# Patient Record
Sex: Female | Born: 1935 | Race: White | Hispanic: No
Health system: Southern US, Community
[De-identification: ages and names within clinical notes are randomized; demographics above are authoritative.]

---

## 2004-10-28 ENCOUNTER — Ambulatory Visit: Payer: Self-pay | Admitting: General Surgery

## 2005-06-02 ENCOUNTER — Ambulatory Visit: Payer: Self-pay | Admitting: Family Medicine

## 2005-06-07 ENCOUNTER — Ambulatory Visit: Payer: Self-pay | Admitting: Family Medicine

## 2005-06-21 ENCOUNTER — Ambulatory Visit: Payer: Self-pay | Admitting: Family Medicine

## 2005-12-27 ENCOUNTER — Ambulatory Visit: Payer: Self-pay | Admitting: Family Medicine

## 2006-06-06 ENCOUNTER — Ambulatory Visit: Payer: Self-pay | Admitting: Family Medicine

## 2006-08-10 ENCOUNTER — Ambulatory Visit: Payer: Self-pay | Admitting: Unknown Physician Specialty

## 2006-11-28 ENCOUNTER — Ambulatory Visit: Payer: Self-pay | Admitting: Unknown Physician Specialty

## 2007-03-14 ENCOUNTER — Ambulatory Visit: Payer: Self-pay | Admitting: Cardiology

## 2007-04-01 ENCOUNTER — Ambulatory Visit: Payer: Self-pay | Admitting: Unknown Physician Specialty

## 2007-04-09 ENCOUNTER — Inpatient Hospital Stay: Payer: Self-pay | Admitting: Unknown Physician Specialty

## 2007-04-14 ENCOUNTER — Encounter: Payer: Self-pay | Admitting: Internal Medicine

## 2007-06-12 ENCOUNTER — Ambulatory Visit: Payer: Self-pay | Admitting: Family Medicine

## 2008-06-16 ENCOUNTER — Ambulatory Visit: Payer: Self-pay | Admitting: Family Medicine

## 2008-11-01 ENCOUNTER — Emergency Department: Payer: Self-pay | Admitting: Emergency Medicine

## 2008-11-03 ENCOUNTER — Ambulatory Visit: Payer: Self-pay | Admitting: General Practice

## 2008-11-05 ENCOUNTER — Ambulatory Visit: Payer: Self-pay | Admitting: General Practice

## 2008-12-29 ENCOUNTER — Ambulatory Visit: Payer: Self-pay | Admitting: Family Medicine

## 2009-08-04 ENCOUNTER — Ambulatory Visit: Payer: Self-pay | Admitting: Family Medicine

## 2010-08-24 ENCOUNTER — Ambulatory Visit: Payer: Self-pay | Admitting: Family Medicine

## 2010-10-18 ENCOUNTER — Ambulatory Visit: Payer: Self-pay | Admitting: Internal Medicine

## 2012-05-03 ENCOUNTER — Inpatient Hospital Stay: Payer: Self-pay | Admitting: Specialist

## 2012-05-03 LAB — URINALYSIS, COMPLETE
Bilirubin,UR: NEGATIVE
Blood: NEGATIVE
Glucose,UR: NEGATIVE mg/dL (ref 0–75)
Ketone: NEGATIVE
Protein: NEGATIVE
RBC,UR: 1 /HPF (ref 0–5)
Squamous Epithelial: <1
WBC UR: 1 /HPF (ref 0–5)

## 2012-05-03 LAB — COMPREHENSIVE METABOLIC PANEL
Albumin: 3.3 g/dL — ABNORMAL LOW (ref 3.4–5.0)
BUN: 20 mg/dL — ABNORMAL HIGH (ref 7–18)
Bilirubin,Total: 0.5 mg/dL (ref 0.2–1.0)
Calcium, Total: 8.9 mg/dL (ref 8.5–10.1)
Chloride: 105 mmol/L (ref 98–107)
Co2: 27 mmol/L (ref 21–32)
EGFR (African American): 51 — ABNORMAL LOW
EGFR (Non-African Amer.): 44 — ABNORMAL LOW
Osmolality: 281 (ref 275–301)
Potassium: 3.9 mmol/L (ref 3.5–5.1)
SGOT(AST): 27 U/L (ref 15–37)
Total Protein: 6.3 g/dL — ABNORMAL LOW (ref 6.4–8.2)

## 2012-05-03 LAB — PROTIME-INR: INR: 1

## 2012-05-03 LAB — CBC
HCT: 36.5 % (ref 35.0–47.0)
HGB: 12.5 g/dL (ref 12.0–16.0)
MCHC: 34.4 g/dL (ref 32.0–36.0)
RBC: 3.86 10*6/uL (ref 3.80–5.20)
WBC: 10.7 10*3/uL (ref 3.6–11.0)

## 2012-05-03 LAB — TSH: Thyroid Stimulating Horm: 3.94 u[IU]/mL

## 2012-05-04 LAB — CBC WITH DIFFERENTIAL/PLATELET
Basophil #: 0 10*3/uL (ref 0.0–0.1)
Basophil %: 0.5 %
Eosinophil #: 0 10*3/uL (ref 0.0–0.7)
HCT: 30.6 % — ABNORMAL LOW (ref 35.0–47.0)
Lymphocyte %: 9.1 %
MCH: 32.6 pg (ref 26.0–34.0)
MCHC: 35.1 g/dL (ref 32.0–36.0)
MCV: 93 fL (ref 80–100)
Monocyte #: 0.6 x10 3/mm (ref 0.2–0.9)
Monocyte %: 9.1 %
Neutrophil #: 5.3 10*3/uL (ref 1.4–6.5)
RBC: 3.31 10*6/uL — ABNORMAL LOW (ref 3.80–5.20)
RDW: 13.7 % (ref 11.5–14.5)
WBC: 6.5 10*3/uL (ref 3.6–11.0)

## 2012-05-04 LAB — BASIC METABOLIC PANEL
Chloride: 104 mmol/L (ref 98–107)
Co2: 29 mmol/L (ref 21–32)
Creatinine: 1 mg/dL (ref 0.60–1.30)
EGFR (African American): 59 — ABNORMAL LOW
Glucose: 111 mg/dL — ABNORMAL HIGH (ref 65–99)
Potassium: 3.6 mmol/L (ref 3.5–5.1)
Sodium: 138 mmol/L (ref 136–145)

## 2012-05-05 LAB — BASIC METABOLIC PANEL
Calcium, Total: 7.8 mg/dL — ABNORMAL LOW (ref 8.5–10.1)
Co2: 28 mmol/L (ref 21–32)
Creatinine: 0.99 mg/dL (ref 0.60–1.30)
EGFR (African American): >60
EGFR (Non-African Amer.): 52 — ABNORMAL LOW
Glucose: 93 mg/dL (ref 65–99)
Potassium: 3.6 mmol/L (ref 3.5–5.1)
Sodium: 134 mmol/L — ABNORMAL LOW (ref 136–145)

## 2012-05-05 LAB — CBC WITH DIFFERENTIAL/PLATELET
Basophil %: 0.5 %
Eosinophil #: 0 10*3/uL (ref 0.0–0.7)
HCT: 27.1 % — ABNORMAL LOW (ref 35.0–47.0)
HGB: 9.5 g/dL — ABNORMAL LOW (ref 12.0–16.0)
Lymphocyte %: 11.6 %
MCHC: 35.1 g/dL (ref 32.0–36.0)
Monocyte %: 9.3 %
Neutrophil #: 5.5 10*3/uL (ref 1.4–6.5)
Neutrophil %: 78.2 %
RBC: 2.87 10*6/uL — ABNORMAL LOW (ref 3.80–5.20)
WBC: 7.1 10*3/uL (ref 3.6–11.0)

## 2012-05-08 ENCOUNTER — Encounter: Payer: Self-pay | Admitting: Internal Medicine

## 2013-02-10 ENCOUNTER — Ambulatory Visit: Payer: Self-pay | Admitting: Family Medicine

## 2013-11-26 IMAGING — CR RIGHT HIP - COMPLETE 2+ VIEW
1 series · 2 of 2 positions shown · non-contrast
Comparison: none

REASON FOR EXAM: fall/painful
COMMENTS:

PROCEDURE:     DXR - DXR HIP RIGHT COMPLETE  - May 03, 2012  [DATE]
RESULT:     Right hip images demonstrate no definite fracture, dislocation
or foreign body. Degenerative changes are present in the right hip. CT or
MRI are available for further assessment if indicated.

[Series 1: t hip ap right · 0.14mm/px · 2 of 2 slices shown]
[im 1/2]
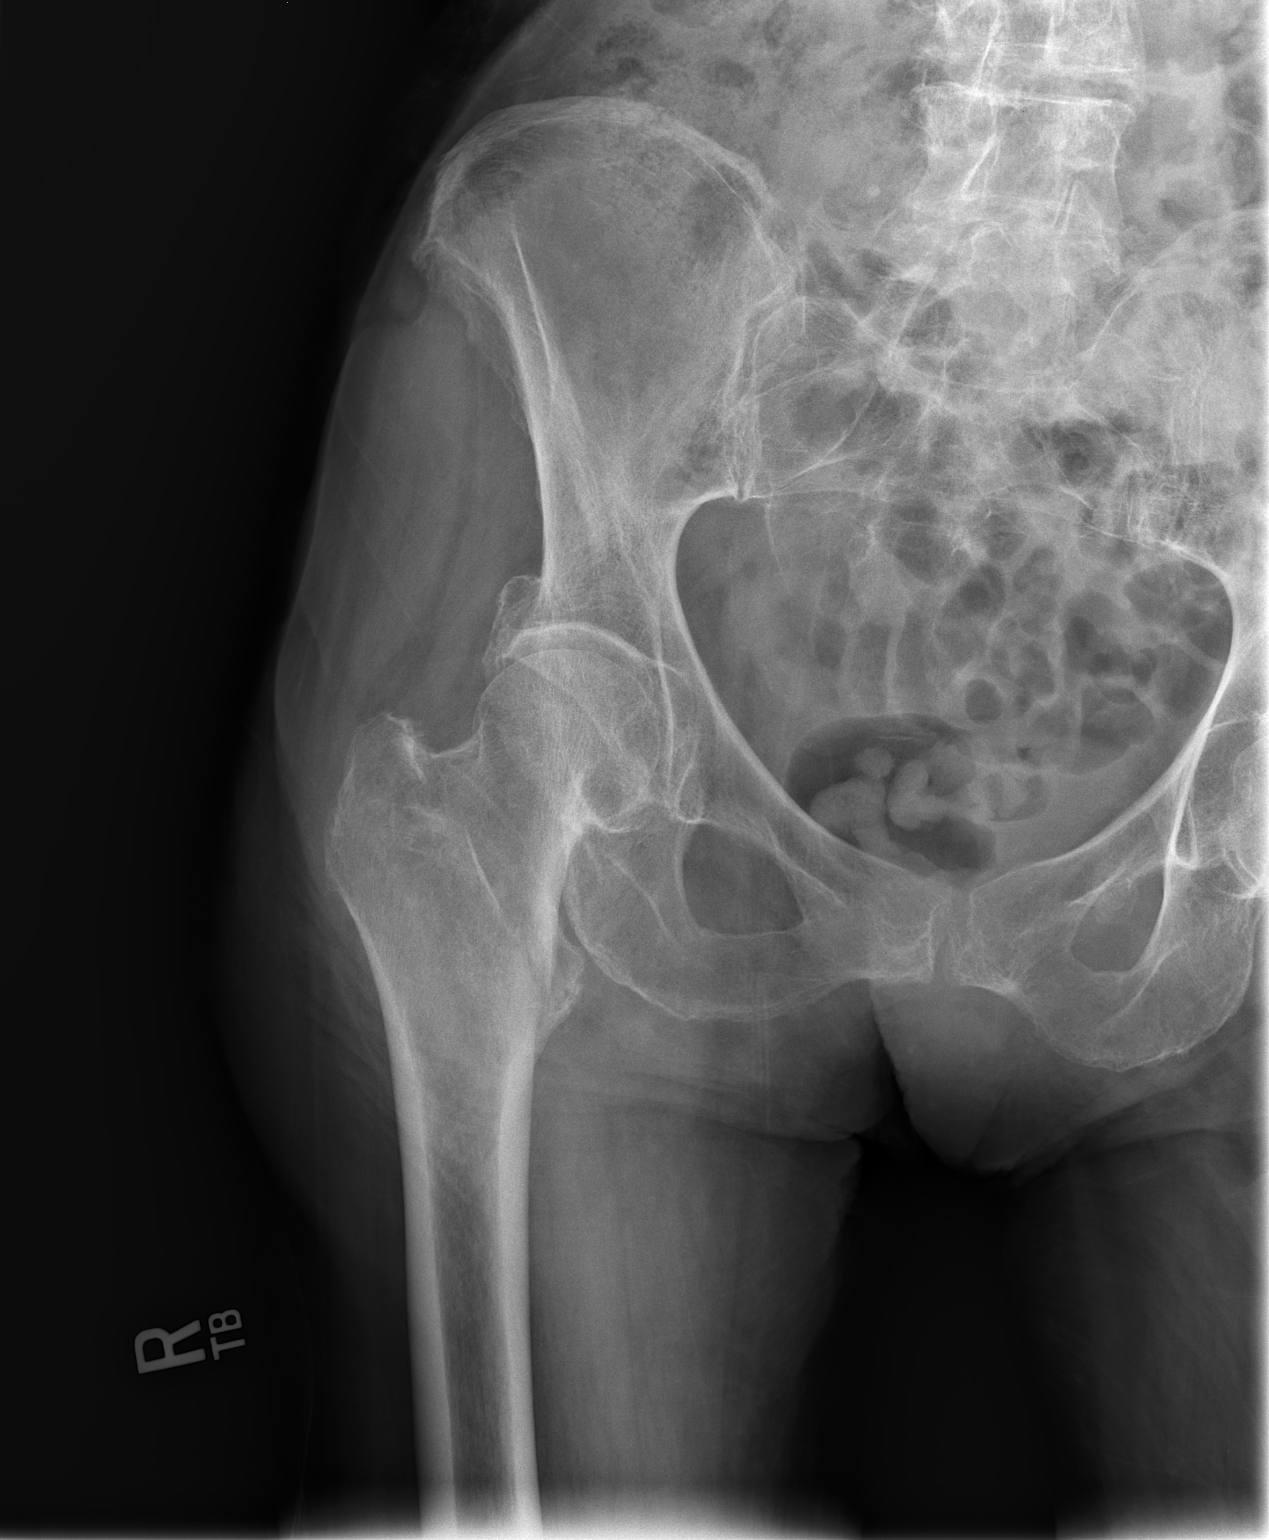
[im 2/2]
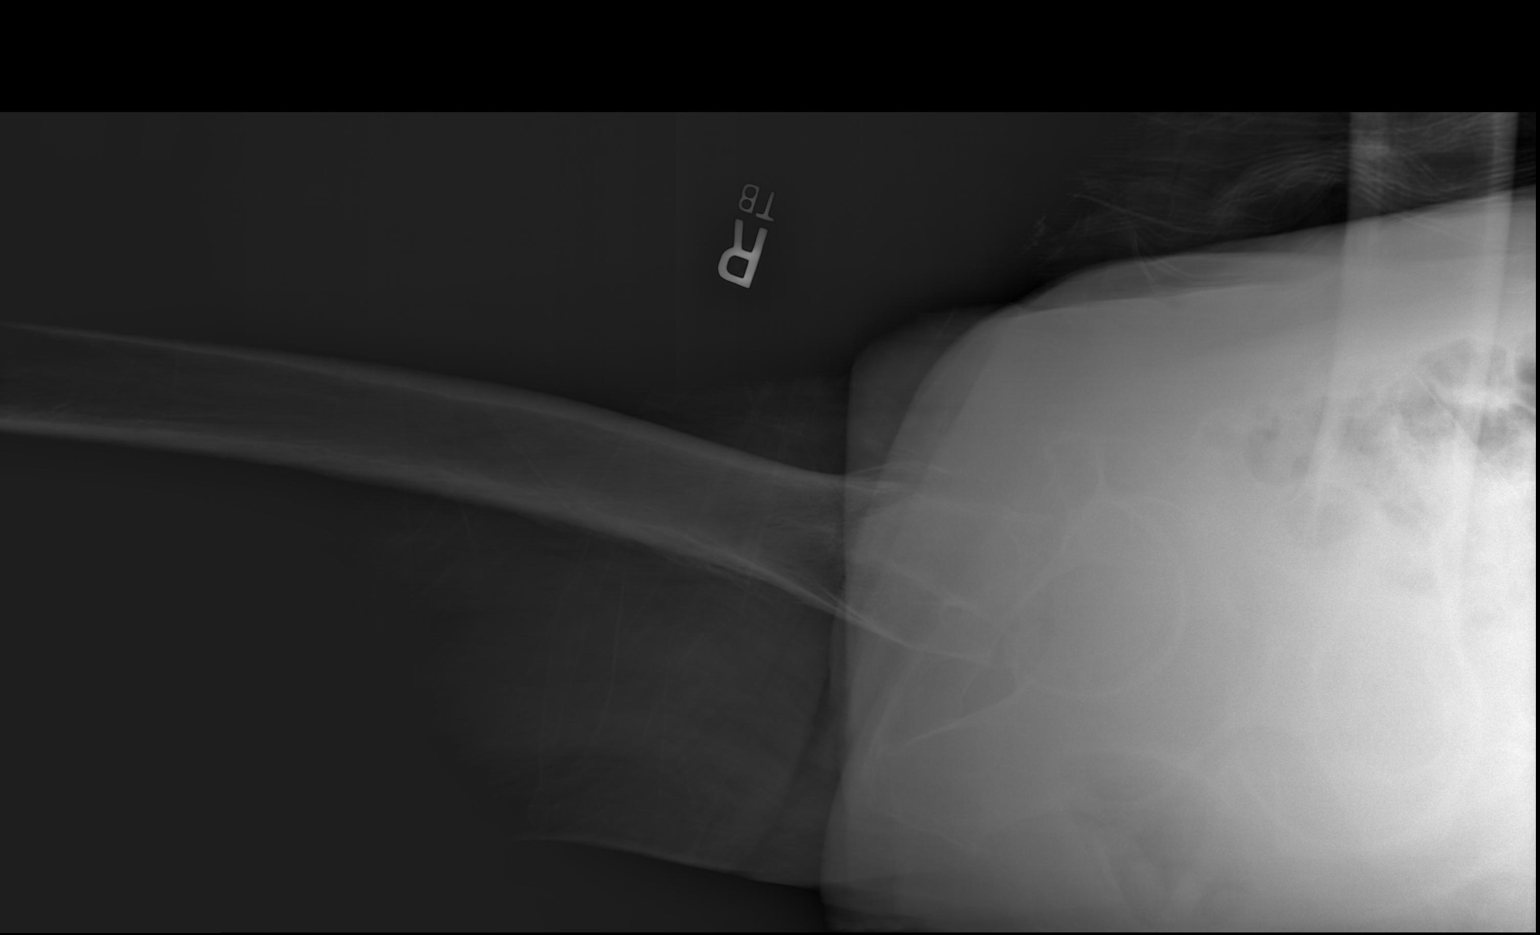

[2 of 2 positions shown; findings below may reference images not displayed]

IMPRESSION: Please see above.

[REDACTED]

## 2013-11-27 IMAGING — CR DG C-ARM 1-60 MIN
2 series · 4 of 4 positions shown · non-contrast
Comparison: none

REASON FOR EXAM: right hip compression pinningf
COMMENTS:

PROCEDURE:     DXR - DXR C-ARM WITH 2 VIEWS RT HIP  - May 04, 2012  [DATE]
RESULT:
The patient is status post open reduction and internal fixation of the right
hip. Visualized hardware appears intact without evidence of loosening or
failure. Native osseous structures are grossly unremarkable.

[cont.]
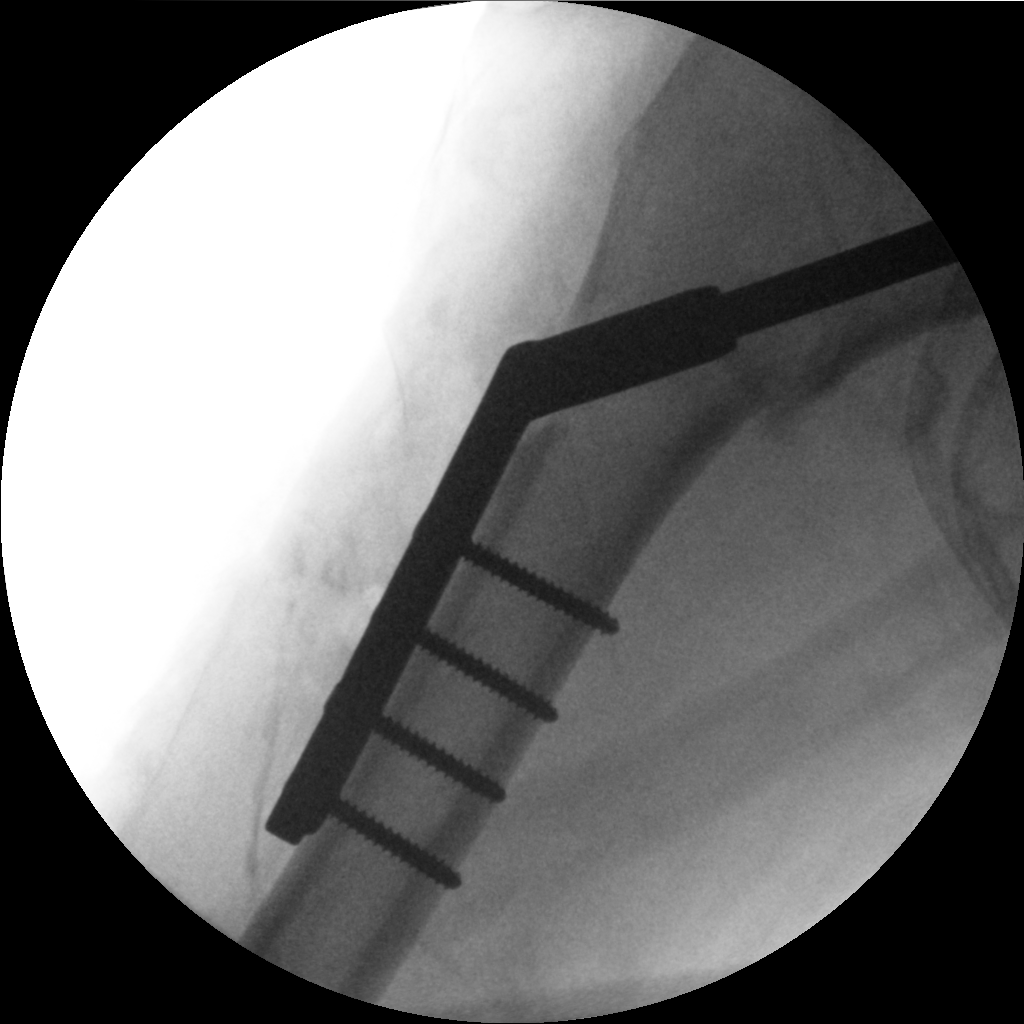

[Series 6001: (person_name),(person_name) · 3 of 3 slices shown]
[im 1/3]
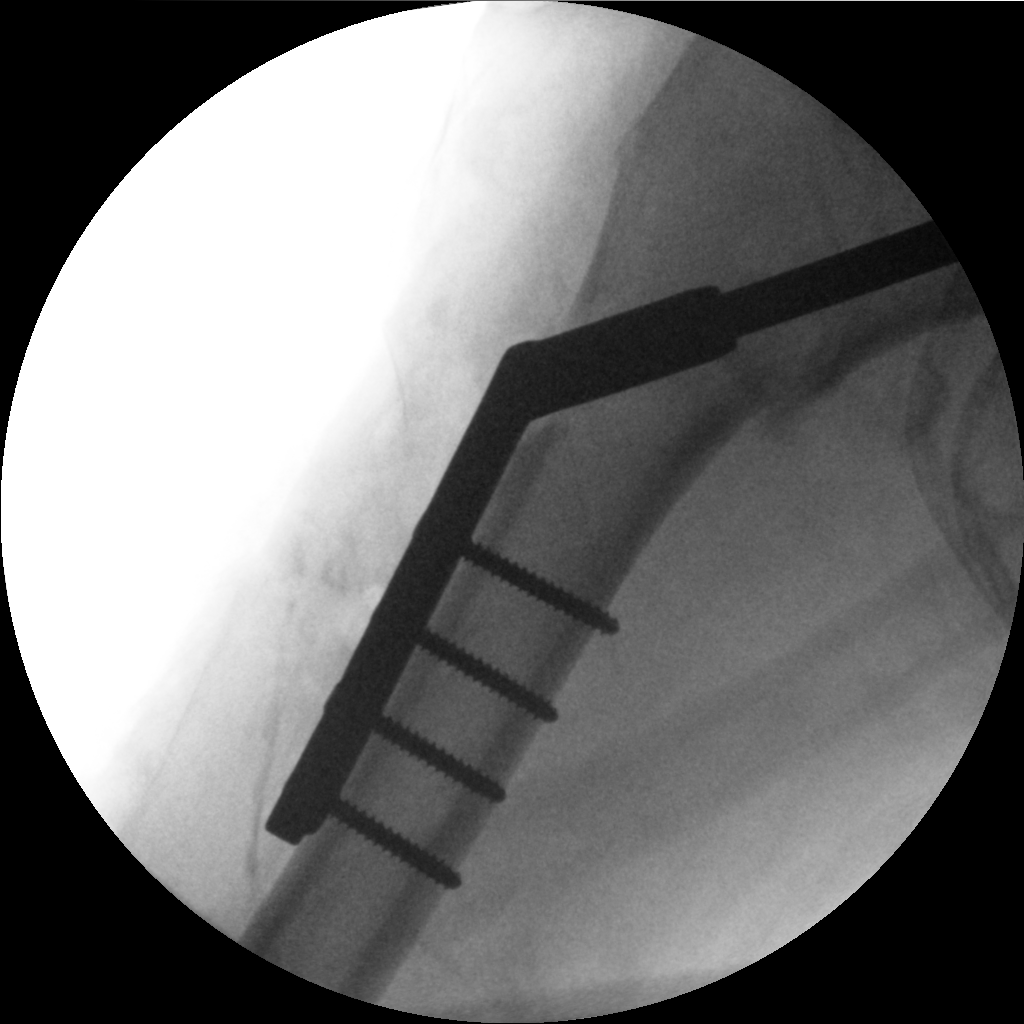
[im 2/3]
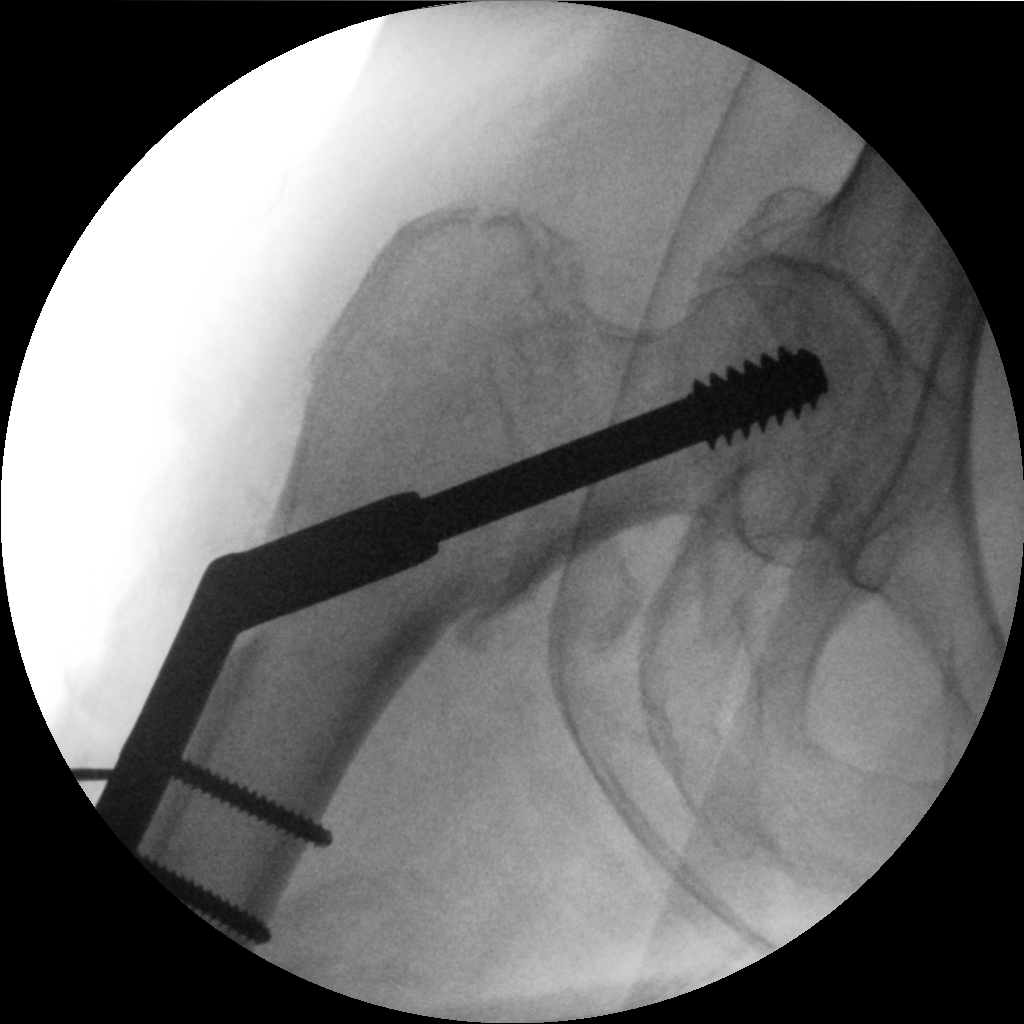
[im 3/3]
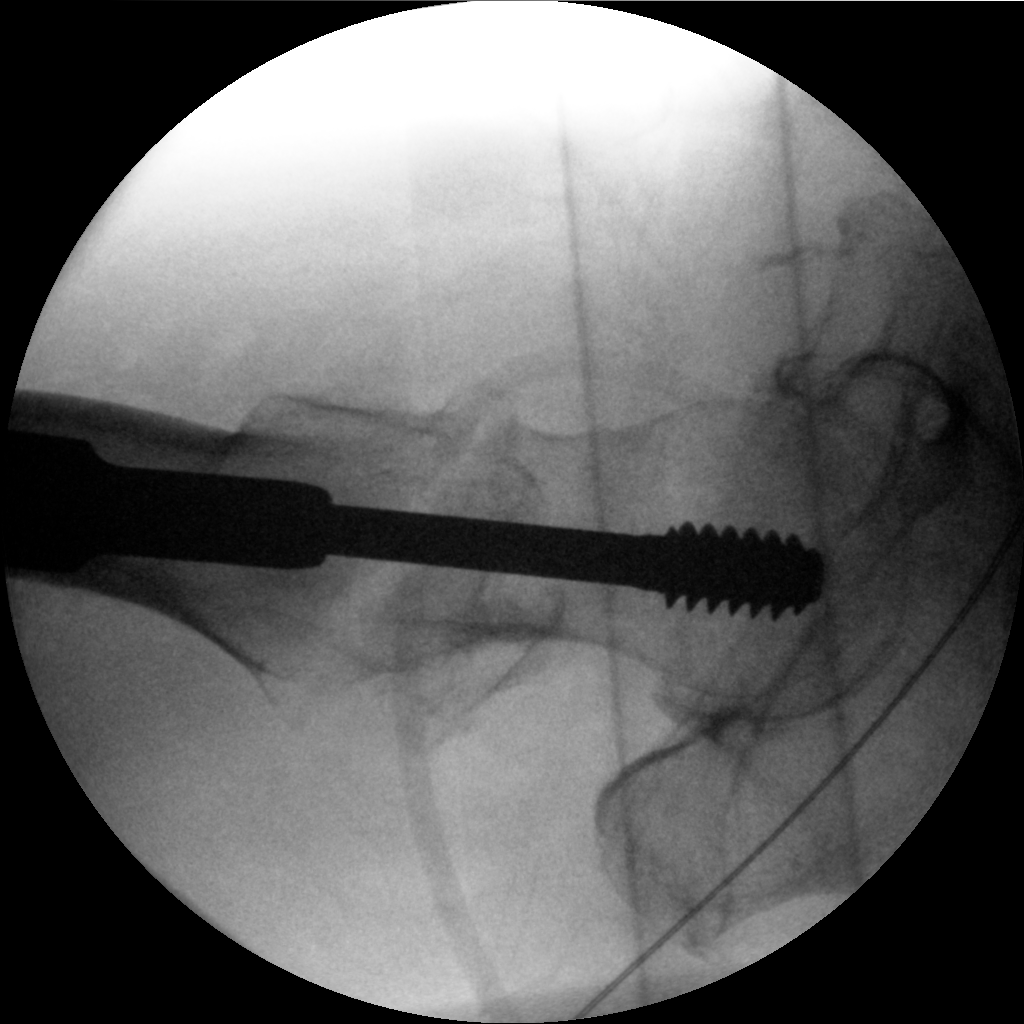

[4 of 4 positions shown; findings below may reference images not displayed]

IMPRESSION: Open reduction internal fixation of the right hip. The remainder of the
interpretation will be left to the performing physician.

## 2014-06-04 DEATH — deceased

## 2014-09-05 IMAGING — US US EXTREM LOW VENOUS*L*
1 series · 14 of 24 positions shown · non-contrast
Comparison: none

REASON FOR EXAM: CALL REPORT [DATE] X 5624 Left leg edema Eval for DVT
COMMENTS:

[Series 1: us extrem low venous*left* · 0.09mm/px · 14 of 24 slices shown]
[im 1/24]
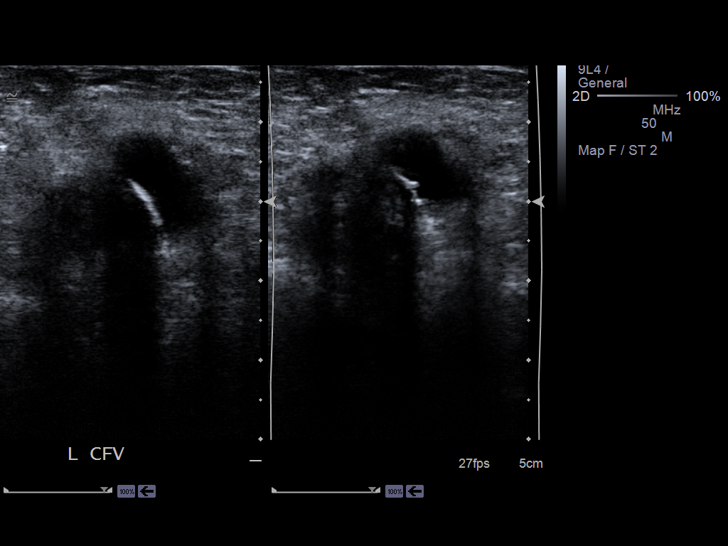
[im 3/24]
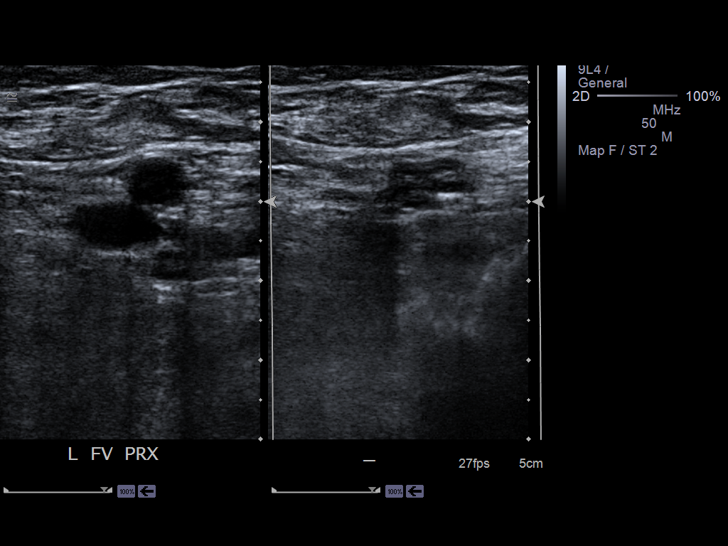
[im 5/24]
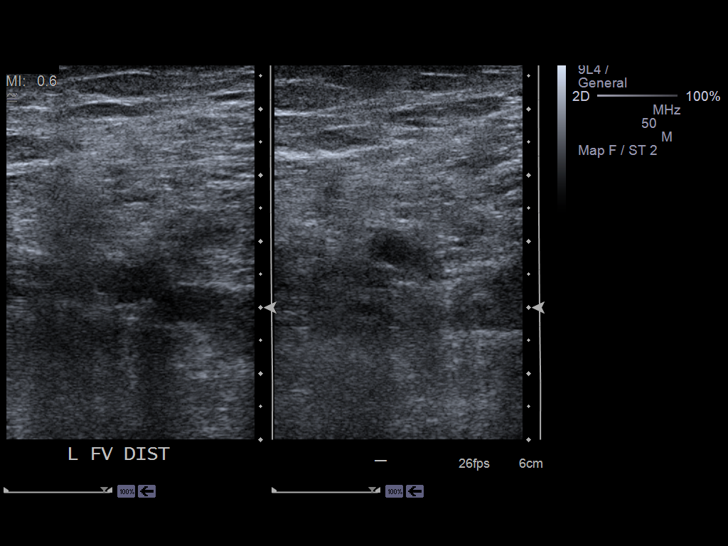
[im 7/24]
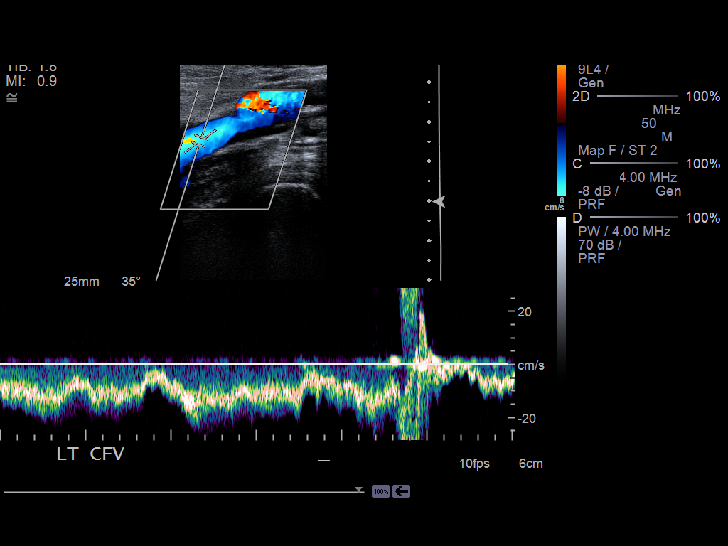
[im 8/24]
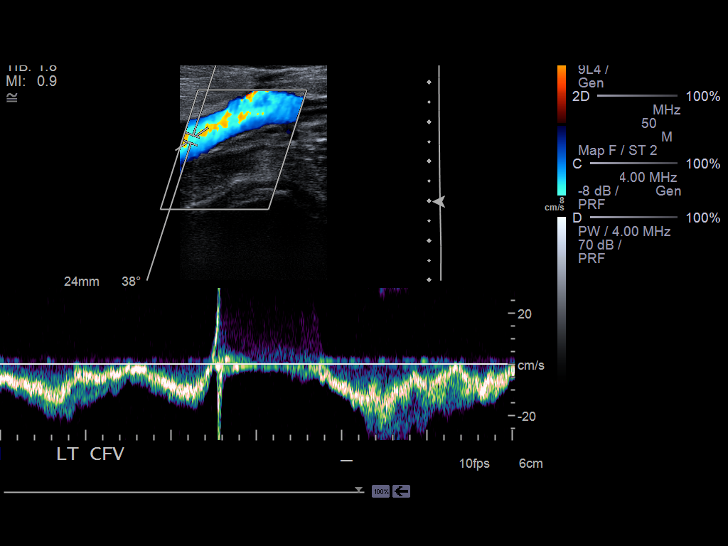
[im 10/24]
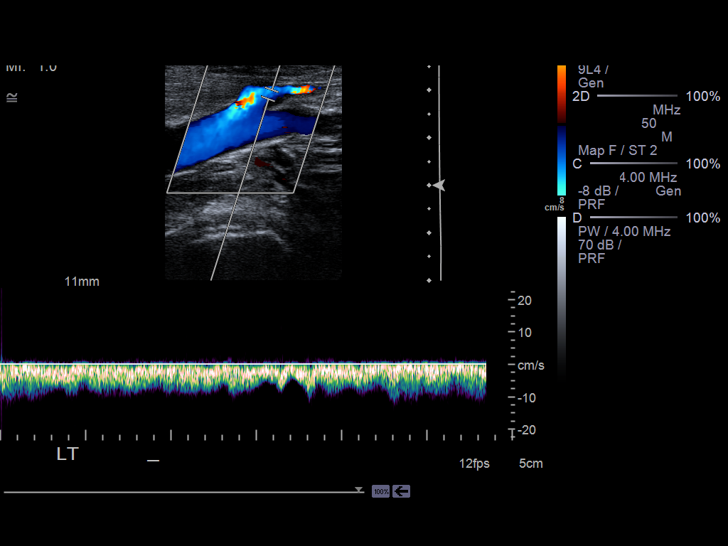
[im 12/24]
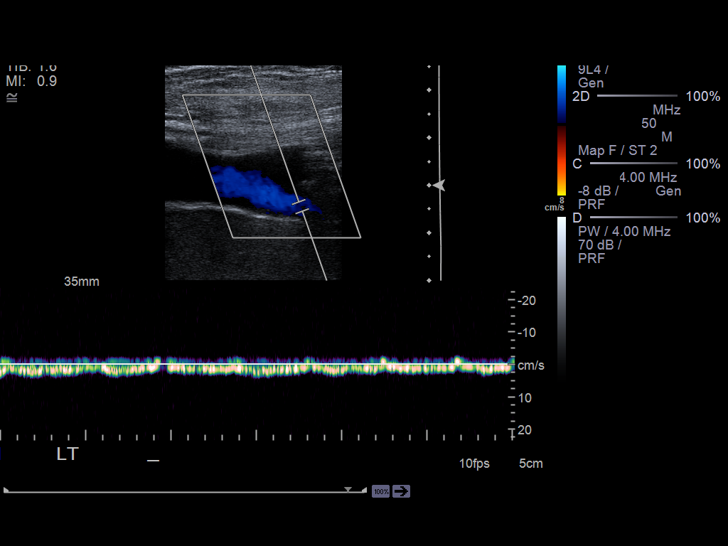
[im 13/24]
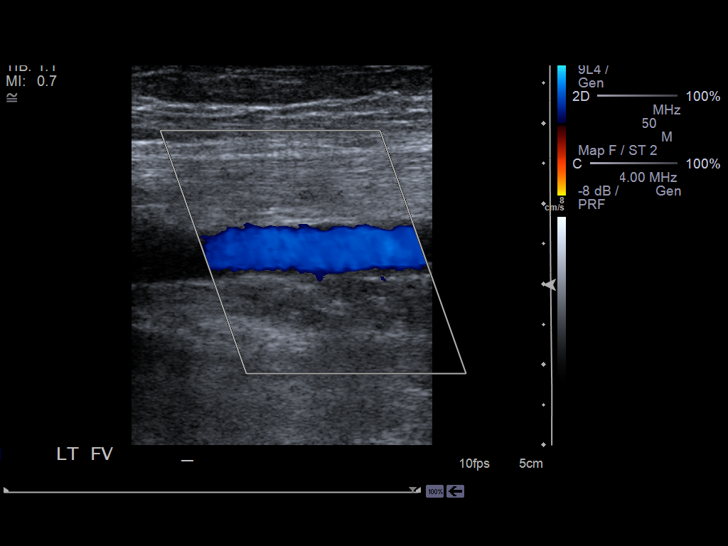
[im 15/24]
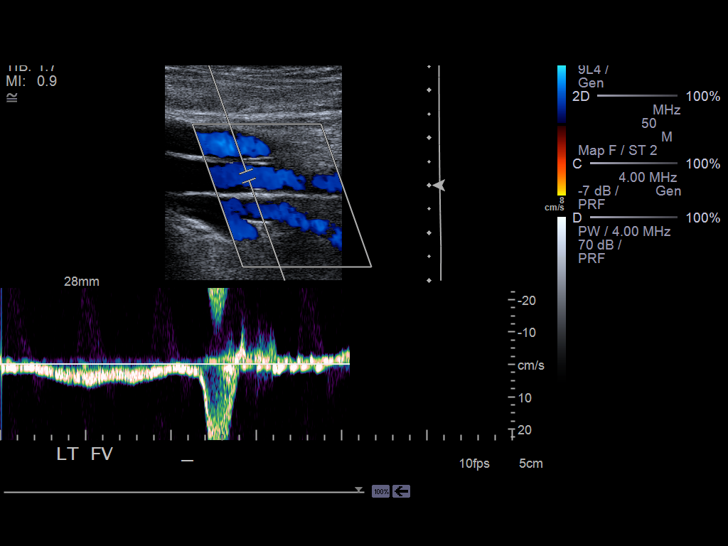
[im 17/24]
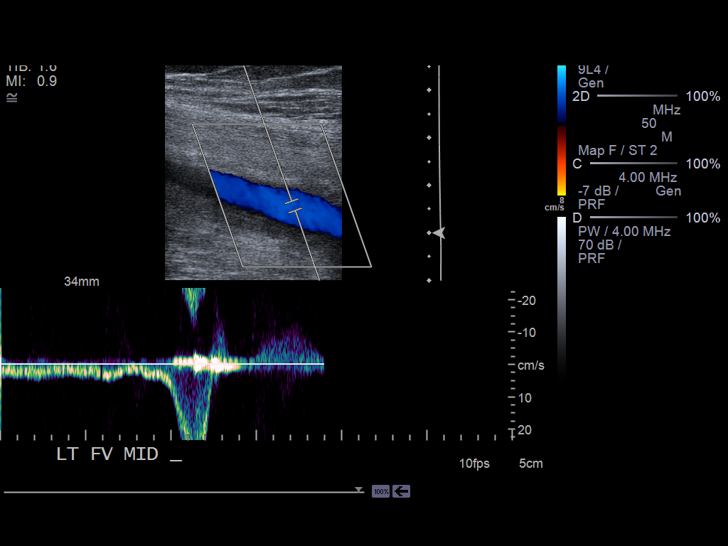
[im 19/24]
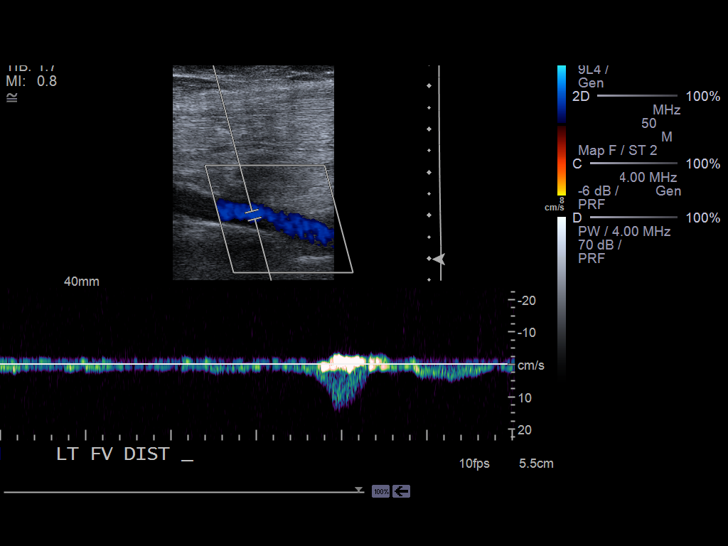
[im 20/24]
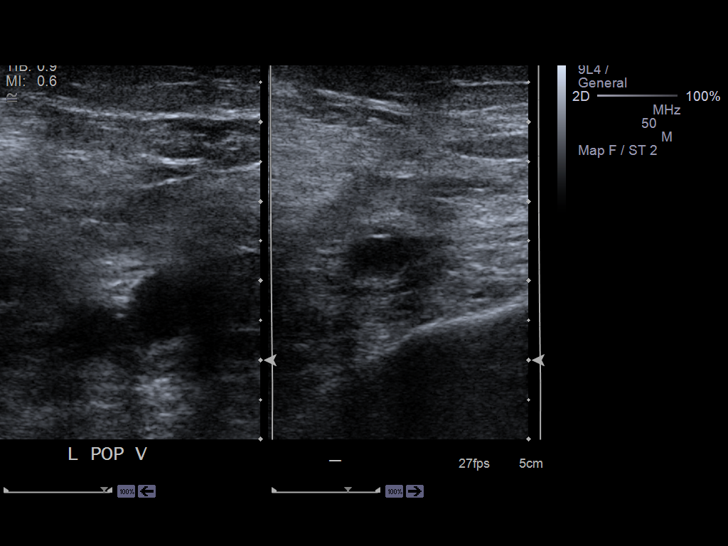
[im 22/24]
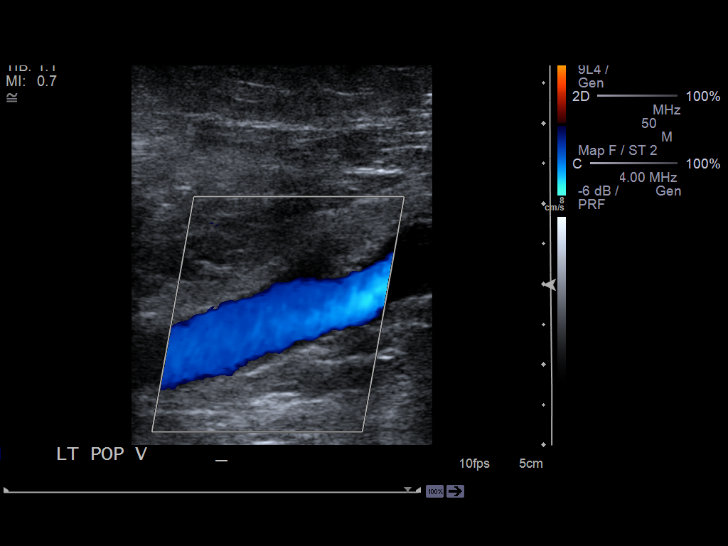
[im 24/24]
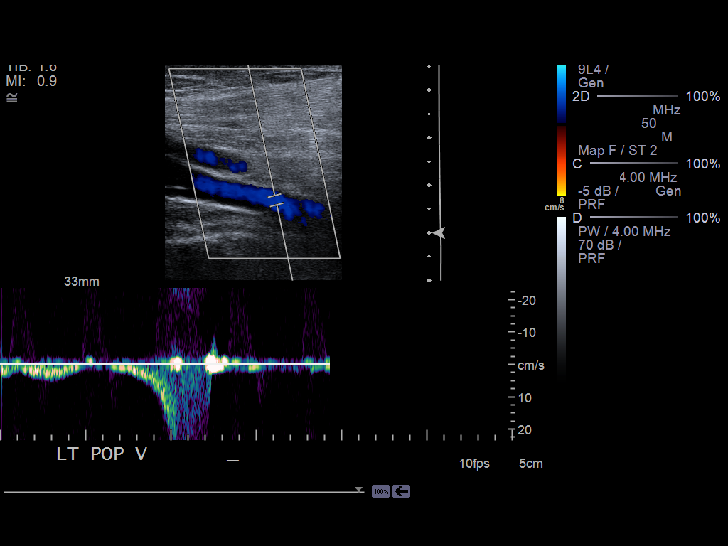

[14 of 24 positions shown; findings below may reference images not displayed]

PROCEDURE:     US  - US DOPPLER LOW EXTR LEFT  - February 10, 2013  [DATE]

RESULT:     Grayscale and color flow Doppler techniques were employed to
evaluate the deep veins of the left lower extremity.

The left common femoral, superficial femoral, and popliteal veins are
normally compressible. The waveform patterns are normal and the color flow
images are normal. The response to the augmentation and Valsalva maneuvers
is normal.
IMPRESSION: There is no evidence of thrombus within the left femoral or
popliteal veins. Multiple attempts were made to call this report to Dr.
Databex.

[REDACTED]

## 2014-12-22 NOTE — H&P (Signed)
Subjective/Chief Complaint Pain right hip    History of Present Illness 81 year old female tripped out her back door today landing on right hip.  Brought to Emergency Room where exam and X-rays show a minimally displaced base neck intertrochanteric fracture right hip. Admitted for medical evaluation and surgery. Discussed treatment with patient and family who agree with surgery.  Risks and benefits of surgery were discussed at length including but not limited to infection, non union, nerve or blood vessed damage, non union, need for repeat surgery, blood clots and lung emboli, and death.  Plan hip pinning in AM.  Has been living alone but has some confusion and will need skilled nursing facility as discussed with family.   Past Med/Surgical Hx:  bells palsy:   hysterectomy:   Cholecystectomy:   ALLERGIES:  Levaquin: Hives  HOME MEDICATIONS: Medication Instructions Status  amlodipine 5 mg oral tablet 1 tab(s) orally once a day Active  fenofibrate 160 mg oral tablet 1 tab(s) orally once a day Active  metoprolol tartrate 50 mg oral tablet 1 tab(s) orally 2 times a day Active  hydrochlorothiazide 25 mg oral tablet 1 tab(s) orally once a day Active  simvastatin 10 mg oral tablet 1 tab(s) orally once a day Active  oxybutynin 10 mg/24 hr oral tablet, extended release 1 tab(s) orally once a day Active  levothyroxine 50 mcg (0.05 mg) oral tablet 1 tab(s) orally once a day Active  donepezil 10 mg oral tablet 1 tab(s) orally once a day Active   Family and Social History:   Family History Non-Contributory    Social History negative tobacco    Place of Living Home   Review of Systems:   Fever/Chills No    Cough No    Sputum No    Abdominal Pain No   Physical Exam:   GEN well developed, well nourished, no acute distress    HEENT pink conjunctivae    NECK supple    RESP normal resp effort    CARD regular rate    ABD denies tenderness    GU foley catheter in place    LYMPH  negative neck    EXTR negative edema, Pain with range of motion of right hip.  Ext rotated but not shortened.  circulation/sensation/motor function good.    SKIN normal to palpation    NEURO motor/sensory function intact    PSYCH alert, some confusion   Lab Results: Thyroid:  30-Aug-13 13:57    Thyroid Stimulating Hormone 3.94 (0.45-4.50 (International Unit)  ----------------------- Pregnant patients have  different reference  ranges for TSH:  - - - - - - - - - -  Pregnant, first trimetser:  0.36 - 2.50 uIU/mL)  Hepatic:  30-Aug-13 13:57    Bilirubin, Total 0.5   Alkaline Phosphatase  44   SGPT (ALT) 15   SGOT (AST) 27   Total Protein, Serum  6.3   Albumin, Serum  3.3  Routine Chem:  30-Aug-13 13:57    Glucose, Serum  114   BUN  20   Creatinine (comp) 1.13   Sodium, Serum 139   Potassium, Serum 3.9   Chloride, Serum 105   CO2, Serum 27   Calcium (Total), Serum 8.9   Osmolality (calc) 281   eGFR (African American)  51   eGFR (Non-African American)  44 (eGFR values <42m/min/1.73 m2 may be an indication of chronic kidney disease (CKD). Calculated eGFR is useful in patients with stable renal function. The eGFR calculation will  not be reliable in acutely ill patients when serum creatinine is changing rapidly. It is not useful in  patients on dialysis. The eGFR calculation may not be applicable to patients at the low and high extremes of body sizes, pregnant women, and vegetarians.)   Anion Gap 7  Routine Coag:  30-Aug-13 13:57    Prothrombin 13.7   INR 1.0 (INR reference interval applies to patients on anticoagulant therapy. A single INR therapeutic range for coumarins is not optimal for all indications; however, the suggested range for most indications is 2.0 - 3.0. Exceptions to the INR Reference Range may include: Prosthetic heart valves, acute myocardial infarction, prevention of myocardial infarction, and combinations of aspirin and anticoagulant. The  need for a higher or lower target INR must be assessed individually. Reference: The Pharmacology and Management of the Vitamin K  antagonists: the seventh ACCP Conference on Antithrombotic and Thrombolytic Therapy. JXBJY.7829 Sept:126 (3suppl): [id number]. A HCT value >55% may artifactually increase the PT.  In one study,  the increase was an average of 25%. Reference:  "Effect on Routine and Special Coagulation Testing Values of Citrate Anticoagulant Adjustment in Patients with High HCT Values." American Journal of Clinical Pathology 2006;126:400-405.)   Activated PTT (APTT) 30.0 (A HCT value >55% may artifactually increase the APTT. In one study, the increase was an average of 19%. Reference: "Effect on Routine and Special Coagulation Testing Values of Citrate Anticoagulant Adjustment in Patients with High HCT Values." American Journal of Clinical Pathology 2006;126:400-405.)  Routine Hem:  30-Aug-13 13:57    WBC (CBC) 10.7   RBC (CBC) 3.86   Hemoglobin (CBC) 12.5   Hematocrit (CBC) 36.5   Platelet Count (CBC) 185 (Result(s) reported on 03 May 2012 at 02:20PM.)   MCV 95   MCH 32.5   MCHC 34.4   RDW 13.9   Radiology Results: XRay:    30-Aug-13 13:40, Hip Right Complete   Hip Right Complete   REASON FOR EXAM:    fall/painful  COMMENTS:       PROCEDURE: DXR - DXR HIP RIGHT COMPLETE  - May 03 2012  1:40PM     RESULT: Right hip images demonstrate no definite fracture, dislocation or   foreign body. Degenerative changes are present in the right hip. CT or   MRI are available for further assessment if indicated.    IMPRESSION:  Please see above.    Dictation Site: 2          Verified By: Sundra Aland, M.D., MD     Assessment/Admission Diagnosis Right intertrochanteric hip fracture    Plan open reduction and internal fixation right hip in AM   Electronic Signatures: Park Breed (MD)  (Signed 30-Aug-13 15:52)  Authored: CHIEF COMPLAINT and HISTORY, PAST  MEDICAL/SURGIAL HISTORY, ALLERGIES, HOME MEDICATIONS, FAMILY AND SOCIAL HISTORY, REVIEW OF SYSTEMS, PHYSICAL EXAM, LABS, Radiology, ASSESSMENT AND PLAN   Last Updated: 30-Aug-13 15:52 by Park Breed (MD)

## 2014-12-22 NOTE — Consult Note (Signed)
PATIENT NAME:  Tobin ChadGODWIN, Nalleli S MR#:  [id number] DATE OF BIRTH:  03/01/1924  DATE OF CONSULTATION:  05/03/2012  REFERRING PHYSICIAN:   CONSULTING PHYSICIAN:  Shaune PollackQing Cauy Melody, MD  ADDENDUM  EKG came back, normal sinus rhythm at 68 beats per minute. No ST elevation or depression. No T wave inversion.   ____________________________ Shaune PollackQing Lola Czerwonka, MD qc:cms D: 05/03/2012 15:47:25 ET T: 05/03/2012 16:58:49 ET JOB#: [id number]  cc: Shaune PollackQing Shakina Choy, MD, <Dictator>  Shaune PollackQING Breanna Mcdaniel MD ELECTRONICALLY SIGNED 05/04/2012 12:23

## 2014-12-22 NOTE — Consult Note (Signed)
PATIENT NAME:  Jody Doyle, Jody Doyle MR#:  [id number] DATE OF BIRTH:  1923-11-06  DATE OF CONSULTATION:  05/03/2012  REFERRING PHYSICIAN:  Dr. Hyacinth MeekerMiller  CONSULTING PHYSICIAN:  Shaune PollackQing Meilin Brosh, MD  PRIMARY CARE PHYSICIAN: Dr. Burnett ShengHedrick   REASON FOR CONSULTATION: Preop clearance and medical management.   HISTORY OF PRESENT ILLNESS: 81 year old Caucasian female from home fell by accident at about 12:30 p.m. today. She was noted to have a hip fracture showed by CAT scan in ED and orthopedic surgeon, Dr. Hyacinth MeekerMiller, request medical management and preop clearance for hip surgery. Patient is alert, awake, oriented. She denies any symptoms except right hip pain. According to her son she is living alone, using walker at home.   PAST MEDICAL HISTORY:  1. Hypertension. 2. Hypothyroidism. 3. Bell'Doyle palsy.    PAST SURGICAL HISTORY:  1. Hysterectomy. 2. Cholecystectomy. 3. Wrist fracture status post surgery.   SOCIAL HISTORY: No smoking, alcohol drinking or illicit drugs.   FAMILY HISTORY: Unremarkable.    REVIEW OF SYSTEMS: CONSTITUTIONAL: Patient denies any fever, chills. No headache or dizziness. No weight loss, weight gain. ENT: No double vision, blurred vision but has decreased vision, has history of Bell'Doyle palsy. No epistaxis. No postnasal drip or dysphagia. RESPIRATORY: No cough, sputum, or shortness of breath. No hemoptysis. CARDIOVASCULAR: No chest pain, palpitation, orthopnea, or nocturnal dyspnea. No leg edema. GASTROINTESTINAL: No abdominal pain, nausea, vomiting, or diarrhea. No bloody stool or melena. GENITOURINARY: No dysuria or hematuria. ENDOCRINE: No polyuria or polydipsia. HEMATOLOGY: No easy bruising, bleeding. NEUROLOGIC: No syncope, loss of consciousness or seizure.   ALLERGIES: Levaquin.   MEDICATIONS:  1. Norvasc 5 mg p.o. daily.  2. Aspirin 81 mg p.o. daily.  3. Donepezil 10mg  1 tablet p.o. daily.  4. Fenofibrate 160 mg p.o. at bedtime.  5. HCTZ 25 mg p.o. daily.  6. Lopressor 50 mg p.o.  daily.  7. Oxybutynin 10 mg p.o. extended-release once daily.  8. Pain reliever 500 mg p.o. 1 tablet p.r.n.  9. Zocor 10 mg p.o. at bedtime.  10. Synthroid 75 mcg p.o. daily   PHYSICAL EXAMINATION:  VITALS: Temperature 97.9, blood pressure 156/53, pulse 68, respirations 20, oxygen saturation 97% on room air.   GENERAL: Patient is alert, awake, oriented in no acute distress.   HEENT: Pupils are round, equal, about 3 mm in diameter, no reaction to light. No discharge from ear or nose. Moist oral mucosa. Clear oropharynx.   NECK: Supple. No JVD or carotid bruit. No lymphadenopathy. No thyromegaly.   CARDIOVASCULAR: S1, S2 regular rate and rhythm. No murmurs or gallops.   PULMONARY: Bilateral air entry. No wheezing or rales.   ABDOMEN: Soft. No distention or tenderness. No organomegaly. Bowel sounds present.   EXTREMITIES: No edema, clubbing, or cyanosis. No calf tenderness but has had tenderness on the right side of hip. Patient cannot move right lower extremity.    SKIN: No rash or jaundice.   NEUROLOGY: Alert and oriented x3. No focal deficit.   LABORATORY, DIAGNOSTIC, AND RADIOLOGICAL DATA: CAT scan of right hip shows right hip fracture, nondisplaced. WBC 10.7, hemoglobin 12.5, platelets 185, glucose 114, BUN 20, creatinine 1.13. Electrolytes normal. TSH 3.94, INR 1.0, PTT 30. No EKG.   IMPRESSION:  1. Right hip fracture.  2. Hypertension.  3. Mild dehydration.   RECOMMENDATIONS:  1. Patient needs EKG.  2. Patient has low to moderate risk for hip fracture surgery but we will review EKG first.  3. Continue hypertension medication including Lopressor. We will increase Norvasc to  10 mg p.o. daily but hold HCTZ due to mild dehydration and continue IV fluid support.  4. Patient needs deep vein prophylaxis after surgery but I will not place order for Lovenox or heparin since patient will get surgery tomorrow. May start deep vein thrombosis prophylaxis after surgery.   Discussed the  patient'Doyle situation and recommendations with patient, patient'Doyle daughter and son.   TIME SPENT: About 50 minutes.  ____________________________ Shaune Pollack, MD qc:cms D: 05/03/2012 15:36:30 ET T: 05/03/2012 15:57:53 ET JOB#: [id number]  cc: Shaune Pollack, MD, <Dictator> Rhona Leavens. Burnett Sheng, MD Shaune Pollack MD ELECTRONICALLY SIGNED 05/04/2012 12:23

## 2014-12-22 NOTE — Op Note (Signed)
PATIENT NAME:  Jody Doyle, Jody Doyle MR#:  [id number] DATE OF BIRTH:  11-14-1923  DATE OF PROCEDURE:  05/04/2012  PREOPERATIVE DIAGNOSIS: Mildly displaced intertrochanteric fracture of the right hip.   POSTOPERATIVE DIAGNOSIS: Mildly displaced intertrochanteric fracture of the right hip.   PROCEDURE PERFORMED: Open reduction of right hip fracture with Synthes DHS compression nail and plate (045135 degree four hole plate, a 95 mm lag screw, four cortical screws, and a compression screw).   SURGEON: Valinda HoarHoward E. Robertson Colclough, M.D.   ANESTHESIA: Spinal.   COMPLICATIONS: None.   DRAINS: Two Hemovacs.  ESTIMATED BLOOD LOSS: 100 mL.  REPLACEMENTS: None.  DESCRIPTION OF PROCEDURE: The patient was brought to the Operating Room where she underwent satisfactory spinal anesthesia and was placed in the supine position on the fracture table. The left leg was flexed and abducted being careful not to twist her left total knee. The right leg was placed in traction and internally rotated slightly. Fluoroscopy showed good position of the fracture fragments. The hip was prepped and draped in sterile fashion and a longitudinal incision was made laterally. Dissection was carried out sharply through subcutaneous tissue and fascia. The vastus musculature was elevated from underneath away from the shaft of the femur. A 1/4 inch drill hole was made in the lateral cortex below the vastus ridge and a guidepin inserted at 135 degrees under fluoroscopic control. The pin was placed in excellent position and it was measured. The step cup reamer was then used to enlarge the opening in the lateral cortex. The track was drilled further up. A 95 mm lag screw with a four-hole, 135 degree plate was inserted and tightened snugly. The plate fit well. Traction was released and the four cortical screws were filled with 4.5 screws. Compression screw was inserted and tightened the fracture up a little more. Fluoroscopy showed the fracture and hardware to  be in good position. The wound was irrigated. The fascia was closed with running 0 Vicryl over a Hemovac drain, the subcutaneous tissue was closed with 2-0 Vicryl over another Hemovac, and the skin was closed with staples. A dry sterile dressing was applied. The patient was transferred to her hospital bed and taken to recovery in good condition. Rotation of the leg was good and she had good range of motion of the hip. ____________________________ Valinda HoarHoward E. Mitchael Luckey, MD hem:slb D: 05/04/2012 09:40:41 ET T: 05/04/2012 10:10:48 ET JOB#: [id number]  cc: Valinda HoarHoward E. Triton Heidrich, MD, <Dictator> Valinda HoarHOWARD E Jamiyah Dingley MD ELECTRONICALLY SIGNED 05/04/2012 18:55

## 2014-12-22 NOTE — Discharge Summary (Signed)
PATIENT NAME:  Jody Doyle, [name] MR#:  [id number] DATE OF BIRTH:  08/31/1924  DATE OF ADMISSION:  05/03/2012 DATE OF DISCHARGE:  05/08/2012  FINAL DIAGNOSES:  1. Displaced intertrochanteric fracture right hip. 2. Hypertension.  3. Hypothyroidism.  4. Bell's palsy history.  OPERATION: 05/04/2012 open reduction internal fixation right hip with a Synthes DHS compression nail.   COMPLICATIONS: None.   CONSULTATION: [facility].   DISCHARGE MEDICATIONS:  1. Norco 5/325 p.r.n. pain.  2. Norvasc 10 mg daily.  3. Surfak 240 mg at bedtime.  4. Fenofibrate 145 mg daily.  5. Synthroid 0.075 mg daily.  6. Lopressor 25 mg b.i.d.  7. Ditropan mg daily.  8. Cepacol lozenge p.r.n.   9. Enteric-coated aspirin 1 p.o. b.i.d. for six weeks.  10. Iron 1 p.o. daily for one month.   HISTORY: This is an 81 year old female who tripped out her back door the day of admission, landed on her right hip. She was brought to the Emergency Room where x-rays showed a minimally displaced intertrochanteric fracture of the right hip. She was admitted for medical elevation and clearance for surgery. Treatment was discussed with the family and the patient and they all agreed with proceeding with surgery. Risks and benefits were discussed with them.   PAST MEDICAL HISTORY: As above.   PAST SURGICAL HISTORY:  1. Hysterectomy. 2. Cholecystectomy.   ALLERGIES: Levaquin causes hives.  HOME MEDICATIONS: As listed above.   FAMILY HISTORY: Unremarkable.   SOCIAL HISTORY: Patient lives at home by herself. Family lives nearby.   REVIEW OF SYSTEMS: Unremarkable.   PHYSICAL EXAMINATION: The patient was alert and cooperative. She did have some confusion. Right leg was externally rotated, not shortened significantly. Pain with motion of the hip. There was local tenderness. Neurovascular status good distally.   LABORATORY, DIAGNOSTIC AND RADIOLOGICAL DATA: Laboratory data on admission was satisfactory.   HOSPITAL COURSE: On  05/04/2012 she underwent left hip nailing with a Synthes DHS compression nail. Postoperatively she did very well. She made slow progress with therapy. Her hemoglobin dropped to 8.3 on the second postop day. She was making satisfactory progress, slow progress with therapy partial weight-bearing on the right leg and is ready for discharge on 05/08/2012. She will be seen in my office in two weeks for exam and x-ray.   ____________________________ Valinda HoarHoward E. Adyn Serna, MD hem:cms D: 05/08/2012 13:56:32 ET T: 05/08/2012 14:18:25 ET JOB#: [id number]  cc: Valinda HoarHoward E. Talayah Picardi, MD, <Dictator> Rhona LeavensJames F. Burnett ShengHedrick, MD  Valinda HoarHOWARD E Aquinnah Devin MD ELECTRONICALLY SIGNED 05/09/2012 9:11
